# Patient Record
Sex: Female | Born: 1995 | Race: White | Hispanic: No | Marital: Single | Smoking: Never smoker
Health system: Southern US, Community
[De-identification: ages and names within clinical notes are randomized; demographics above are authoritative.]

## PROBLEM LIST (undated history)

## (undated) HISTORY — PX: HERNIA REPAIR: SHX51

---

## 2013-10-07 ENCOUNTER — Encounter (HOSPITAL_COMMUNITY): Payer: Self-pay | Admitting: Emergency Medicine

## 2013-10-07 ENCOUNTER — Emergency Department (HOSPITAL_COMMUNITY)
Admission: EM | Admit: 2013-10-07 | Discharge: 2013-10-08 | Disposition: A | Discharge status: Home / Self Care | Payer: BC Managed Care – PPO | Attending: Emergency Medicine | Admitting: Emergency Medicine

## 2013-10-07 ENCOUNTER — Emergency Department (HOSPITAL_COMMUNITY): Payer: BC Managed Care – PPO

## 2013-10-07 DIAGNOSIS — Y9389 Activity, other specified: Secondary | ICD-10-CM | POA: Insufficient documentation

## 2013-10-07 DIAGNOSIS — T148XXA Other injury of unspecified body region, initial encounter: Secondary | ICD-10-CM

## 2013-10-07 DIAGNOSIS — Q7649 Other congenital malformations of spine, not associated with scoliosis: Secondary | ICD-10-CM | POA: Insufficient documentation

## 2013-10-07 DIAGNOSIS — S161XXA Strain of muscle, fascia and tendon at neck level, initial encounter: Secondary | ICD-10-CM

## 2013-10-07 DIAGNOSIS — R110 Nausea: Secondary | ICD-10-CM | POA: Insufficient documentation

## 2013-10-07 DIAGNOSIS — S139XXA Sprain of joints and ligaments of unspecified parts of neck, initial encounter: Secondary | ICD-10-CM | POA: Insufficient documentation

## 2013-10-07 DIAGNOSIS — Y92410 Unspecified street and highway as the place of occurrence of the external cause: Secondary | ICD-10-CM | POA: Insufficient documentation

## 2013-10-07 DIAGNOSIS — IMO0002 Reserved for concepts with insufficient information to code with codable children: Secondary | ICD-10-CM | POA: Insufficient documentation

## 2013-10-07 DIAGNOSIS — S060X0A Concussion without loss of consciousness, initial encounter: Secondary | ICD-10-CM | POA: Insufficient documentation

## 2013-10-07 MED ORDER — HYDROCODONE-ACETAMINOPHEN 5-325 MG PO TABS
2.0000 | ORAL_TABLET | Freq: Once | ORAL | Status: AC
  Administered 2013-10-07: 2 via ORAL
  Filled 2013-10-07: qty 2

## 2013-10-07 MED ORDER — CYCLOBENZAPRINE HCL 10 MG PO TABS
10.0000 mg | ORAL_TABLET | Freq: Once | ORAL | Status: AC
  Administered 2013-10-07: 10 mg via ORAL
  Filled 2013-10-07: qty 1
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
## 2013-10-08 MED ORDER — HYDROCODONE-ACETAMINOPHEN 5-325 MG PO TABS: 2.0000 | ORAL_TABLET | ORAL | Status: DC | PRN

## 2013-10-08 MED ORDER — IBUPROFEN 800 MG PO TABS: 800.0000 mg | ORAL_TABLET | Freq: Three times a day (TID) | ORAL | Status: DC

## 2013-10-08 MED ORDER — CYCLOBENZAPRINE HCL 10 MG PO TABS: 10.0000 mg | ORAL_TABLET | Freq: Two times a day (BID) | ORAL | Status: DC | PRN

## 2013-10-08 NOTE — ED Notes (Signed)
Mother states that she felt a knot come up on left shoulder. EDP made aware. Patient placed in C-collar per EDP Dr.Opitz order.
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
## 2018-11-28 ENCOUNTER — Other Ambulatory Visit: Payer: Self-pay

## 2018-11-28 ENCOUNTER — Emergency Department (HOSPITAL_COMMUNITY)
Admission: EM | Admit: 2018-11-28 | Discharge: 2018-11-28 | Disposition: A | Discharge status: Home / Self Care | Payer: BLUE CROSS/BLUE SHIELD | Attending: Emergency Medicine | Admitting: Emergency Medicine

## 2018-11-28 ENCOUNTER — Encounter (HOSPITAL_COMMUNITY): Payer: Self-pay

## 2018-11-28 DIAGNOSIS — R112 Nausea with vomiting, unspecified: Secondary | ICD-10-CM | POA: Diagnosis not present

## 2018-11-28 DIAGNOSIS — Z79899 Other long term (current) drug therapy: Secondary | ICD-10-CM | POA: Insufficient documentation

## 2018-11-28 DIAGNOSIS — R1110 Vomiting, unspecified: Secondary | ICD-10-CM | POA: Diagnosis present

## 2018-11-28 LAB — URINALYSIS, ROUTINE W REFLEX MICROSCOPIC
Bilirubin Urine: NEGATIVE
Glucose, UA: NEGATIVE mg/dL
Ketones, ur: 80 mg/dL — AB
Nitrite: NEGATIVE
Protein, ur: NEGATIVE mg/dL
Specific Gravity, Urine: 1.026 (ref 1.005–1.030)
pH: 5 (ref 5.0–8.0)

## 2018-11-28 MED ORDER — ONDANSETRON 4 MG PO TBDP
ORAL_TABLET | ORAL | Status: AC
  Administered 2018-11-28: 13:00:00
  Filled 2018-11-28: qty 1

## 2018-11-28 MED ORDER — ONDANSETRON 4 MG PO TBDP: 4.0000 mg | ORAL_TABLET | Freq: Three times a day (TID) | ORAL | 0 refills | Status: AC | PRN

## 2018-11-28 MED ORDER — ONDANSETRON HCL 4 MG PO TABS
4.0000 mg | ORAL_TABLET | Freq: Once | ORAL | Status: AC
  Administered 2018-11-28: 4 mg via ORAL

## 2018-11-28 NOTE — ED Triage Notes (Signed)
Pt has been throwing up since 3 am this morning. States she hasn't been able to keep anything down. Thought her symptoms were caused by acid reflux, but is now not sure because she vomited so much. Is very anxious in triage.

## 2018-11-28 NOTE — ED Provider Notes (Signed)
[facility] Provider Note   CSN: [id number] Arrival date & time: 11/28/18  1219     History   Chief Complaint Chief Complaint  Patient presents with  . Emesis    HPI Tanya Padilla is a 23 y.o. female.  HPI   23 year old female with nausea and vomiting.  Onset around 3 AM this morning.  Multiple episodes.  Prior to getting some Zofran here in the emergency room she was unable to keep anything down.  She is having some upper abdominal pain.  She is unsure this may be related to all her vomiting or an abdominal workout she did at the gym yesterday.  No fevers or chills.  No diarrhea.  No urinary complaints.  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  No sick contacts.   History reviewed. No pertinent past medical history.  There are no active problems to display for this patient.   Past Surgical History:  Procedure Laterality Date  . HERNIA REPAIR       OB History   No obstetric history on file.      Home Medications    Prior to Admission medications   Medication Sig Start Date End Date Taking? Authorizing Provider  amoxicillin-clavulanate (AUGMENTIN) 875-125 MG tablet Take 1 tablet by mouth 2 (two) times daily. 11/20/18   [provider]  cyclobenzaprine (FLEXERIL) 10 MG tablet Take 1 tablet (10 mg total) by mouth 2 (two) times daily as needed for muscle spasms. 10/08/13   Sunnie Nielsenpitz, Brian, MD  HYDROcodone-acetaminophen (NORCO/VICODIN) 5-325 MG per tablet Take 2 tablets by mouth every 4 (four) hours as needed. 10/08/13   Sunnie Nielsenpitz, Brian, MD  ibuprofen (ADVIL,MOTRIN) 800 MG tablet Take 1 tablet (800 mg total) by mouth 3 (three) times daily. 10/08/13   Sunnie Nielsenpitz, Brian, MD  meclizine (ANTIVERT) 25 MG tablet Take 1 tablet by mouth every 6 (six) hours as needed. 11/20/18   [provider]    Family History No family history on file.  Social History Social History   Tobacco Use  . Smoking status: Never Smoker    . Smokeless tobacco: Never Used  Substance Use Topics  . Alcohol use: No  . Drug use: No     Allergies   Patient has no known allergies.   Review of Systems Review of Systems  All systems reviewed and negative, other than as noted in HPI.  Physical Exam Updated Vital Signs BP 114/67 (BP Location: Right Arm)   Pulse (!) 107   Temp 98.8 F (37.1 C) (Oral)   Resp 18   Ht 5\' 2"  (1.575 m)   Wt 62.6 kg   SpO2 100%   BMI 25.24 kg/m   Physical Exam Vitals signs and nursing note reviewed.  Constitutional:      General: She is not in acute distress.    Appearance: She is well-developed.  HENT:     Head: Normocephalic and atraumatic.  Eyes:     General:        Right eye: No discharge.        Left eye: No discharge.     Conjunctiva/sclera: Conjunctivae normal.  Neck:     Musculoskeletal: Neck supple.  Cardiovascular:     Rate and Rhythm: Regular rhythm. Tachycardia present.     Heart sounds: Normal heart sounds. No murmur. No friction rub. No gallop.      Comments: Mild tachycardia Pulmonary:     Effort: Pulmonary effort is  normal. No respiratory distress.     Breath sounds: Normal breath sounds.  Abdominal:     General: There is no distension.     Palpations: Abdomen is soft.     Tenderness: There is no abdominal tenderness.     Comments: Mild epigastric tenderness without rebound or guarding.  No distention  Musculoskeletal:        General: No tenderness.  Skin:    General: Skin is warm and dry.  Neurological:     Mental Status: She is alert.  Psychiatric:        Behavior: Behavior normal.        Thought Content: Thought content normal.      ED Treatments / Results  Labs (all labs ordered are listed, but only abnormal results are displayed) Labs Reviewed  URINALYSIS, ROUTINE W REFLEX MICROSCOPIC - Abnormal; Notable for the following components:      Result Value   APPearance HAZY (*)    Hgb urine dipstick SMALL (*)    Ketones, ur 80 (*)     Leukocytes, UA SMALL (*)    Bacteria, UA RARE (*)    All other components within normal limits    EKG None  Radiology No results found.  Procedures Procedures (including critical care time)  Medications Ordered in ED Medications  ondansetron (ZOFRAN) tablet 4 mg (4 mg Oral Given 11/28/18 1237)  ondansetron (ZOFRAN-ODT) 4 MG disintegrating tablet (  Given 11/28/18 1237)     Initial Impression / Assessment and Plan / ED Course  I have reviewed the triage vital signs and the nursing notes.  Pertinent labs & imaging results that were available during my care of the patient were reviewed by me and considered in my medical decision making (see chart for details).    23 year old female with nausea/vomiting.  Minimal epigastric tenderness on exam.  Symptoms much improved after Zofran.  She is very apprehensive about venipuncture for blood samples or IV placement for fluids.  She was tolerating p.o. here in the emergency room.  I doubt emergent etiology of her symptoms.  They have only been going on for about 12 hours at this point.  Will discharge with Zofran.  Bland diet.  Advance as tolerated.  Emergent return precautions discussed.  Outpatient follow-up otherwise.  It has been determined that no acute conditions requiring further emergency intervention are present at this time. The patient has been advised of the diagnosis and plan. I reviewed any labs and imaging including any potential incidental findings. I have reviewed nursing notes and appropriate previous records. We have discussed signs and symptoms that warrant return to the ED and they are listed in the discharge instructions.      Final Clinical Impressions(s) / ED Diagnoses   Final diagnoses:  Nausea and vomiting, intractability of vomiting not specified, unspecified vomiting type    ED Discharge Orders         Ordered    ondansetron (ZOFRAN ODT) 4 MG disintegrating tablet  Every 8 hours PRN     11/28/18 1449             Raeford RazorKohut, Lorita Forinash, MD 11/28/18 [id number]

## 2022-11-19 ENCOUNTER — Other Ambulatory Visit: Payer: Self-pay | Admitting: Physician Assistant

## 2022-11-19 ENCOUNTER — Ambulatory Visit
Admission: RE | Admit: 2022-11-19 | Discharge: 2022-11-19 | Disposition: A | Discharge status: Home / Self Care | Payer: BC Managed Care – PPO | Source: Ambulatory Visit | Attending: Physician Assistant | Admitting: Physician Assistant

## 2022-11-19 DIAGNOSIS — R0989 Other specified symptoms and signs involving the circulatory and respiratory systems: Secondary | ICD-10-CM

## 2022-11-19 DIAGNOSIS — R051 Acute cough: Secondary | ICD-10-CM
